# Patient Record
Sex: Male | Born: 1980 | Race: Black or African American | Hispanic: No | Marital: Single | Smoking: Never smoker
Health system: Southern US, Community
[De-identification: ages and names within clinical notes are randomized; demographics above are authoritative.]

## PROBLEM LIST (undated history)

## (undated) DIAGNOSIS — E785 Hyperlipidemia, unspecified: Secondary | ICD-10-CM

---

## 2001-02-25 ENCOUNTER — Emergency Department (HOSPITAL_COMMUNITY): Admission: EM | Admit: 2001-02-25 | Discharge: 2001-02-25 | Payer: Self-pay | Admitting: Emergency Medicine

## 2001-03-01 ENCOUNTER — Emergency Department (HOSPITAL_COMMUNITY): Admission: EM | Admit: 2001-03-01 | Discharge: 2001-03-01 | Payer: Self-pay | Admitting: Emergency Medicine

## 2001-03-06 ENCOUNTER — Emergency Department (HOSPITAL_COMMUNITY): Admission: EM | Admit: 2001-03-06 | Discharge: 2001-03-07 | Payer: Self-pay | Admitting: Emergency Medicine

## 2002-09-29 ENCOUNTER — Emergency Department (HOSPITAL_COMMUNITY): Admission: EM | Admit: 2002-09-29 | Discharge: 2002-09-29 | Payer: Self-pay | Admitting: Emergency Medicine

## 2011-12-16 ENCOUNTER — Other Ambulatory Visit: Payer: Self-pay | Admitting: *Deleted

## 2012-05-01 ENCOUNTER — Emergency Department (HOSPITAL_COMMUNITY)
Admission: EM | Admit: 2012-05-01 | Discharge: 2012-05-01 | Disposition: A | Discharge status: Home / Self Care | Payer: PRIVATE HEALTH INSURANCE | Attending: Emergency Medicine | Admitting: Emergency Medicine

## 2012-05-01 ENCOUNTER — Encounter (HOSPITAL_COMMUNITY): Payer: Self-pay

## 2012-05-01 DIAGNOSIS — Y9389 Activity, other specified: Secondary | ICD-10-CM | POA: Insufficient documentation

## 2012-05-01 DIAGNOSIS — S8990XA Unspecified injury of unspecified lower leg, initial encounter: Secondary | ICD-10-CM | POA: Insufficient documentation

## 2012-05-01 DIAGNOSIS — Y92410 Unspecified street and highway as the place of occurrence of the external cause: Secondary | ICD-10-CM | POA: Insufficient documentation

## 2012-05-01 DIAGNOSIS — S99919A Unspecified injury of unspecified ankle, initial encounter: Secondary | ICD-10-CM | POA: Insufficient documentation

## 2012-05-01 DIAGNOSIS — S79919A Unspecified injury of unspecified hip, initial encounter: Secondary | ICD-10-CM | POA: Insufficient documentation
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
## 2012-05-01 NOTE — ED Provider Notes (Signed)
Medical screening examination/treatment/procedure(s) were performed by non-physician practitioner and as supervising physician I was immediately available for consultation/collaboration.   Cameryn Schum J. Lucian Baswell, MD 05/01/12 2316 

## 2012-05-07 ENCOUNTER — Telehealth (HOSPITAL_COMMUNITY): Payer: Self-pay | Admitting: Emergency Medicine

## 2012-06-22 ENCOUNTER — Other Ambulatory Visit: Payer: Self-pay | Admitting: Family Medicine

## 2012-06-22 ENCOUNTER — Ambulatory Visit
Admission: RE | Admit: 2012-06-22 | Discharge: 2012-06-22 | Disposition: A | Discharge status: Home / Self Care | Payer: PRIVATE HEALTH INSURANCE | Source: Ambulatory Visit | Attending: Family Medicine | Admitting: Family Medicine

## 2012-06-22 DIAGNOSIS — T148XXA Other injury of unspecified body region, initial encounter: Secondary | ICD-10-CM
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
## 2014-03-31 ENCOUNTER — Emergency Department (HOSPITAL_COMMUNITY): Payer: PRIVATE HEALTH INSURANCE

## 2014-03-31 ENCOUNTER — Encounter (HOSPITAL_COMMUNITY): Payer: Self-pay | Admitting: Emergency Medicine

## 2014-03-31 DIAGNOSIS — M62838 Other muscle spasm: Secondary | ICD-10-CM | POA: Insufficient documentation

## 2014-03-31 DIAGNOSIS — Z8639 Personal history of other endocrine, nutritional and metabolic disease: Secondary | ICD-10-CM | POA: Diagnosis not present

## 2014-03-31 DIAGNOSIS — R079 Chest pain, unspecified: Secondary | ICD-10-CM | POA: Diagnosis present

## 2014-03-31 LAB — BASIC METABOLIC PANEL
Anion gap: 8 (ref 5–15)
BUN: 9 mg/dL (ref 6–23)
CALCIUM: 9.2 mg/dL (ref 8.4–10.5)
CHLORIDE: 103 meq/L (ref 96–112)
CO2: 28 mmol/L (ref 19–32)
CREATININE: 0.92 mg/dL (ref 0.50–1.35)
GFR calc non Af Amer: >90 mL/min (ref >90)
Glucose, Bld: 106 mg/dL — ABNORMAL HIGH (ref 70–99)
Potassium: 3.6 mmol/L (ref 3.5–5.1)
SODIUM: 139 mmol/L (ref 135–145)

## 2014-03-31 LAB — CBC WITH DIFFERENTIAL/PLATELET
BASOS PCT: 0 % (ref 0–1)
Basophils Absolute: 0 10*3/uL (ref 0.0–0.1)
Eosinophils Absolute: 0.2 10*3/uL (ref 0.0–0.7)
Eosinophils Relative: 2 % (ref 0–5)
HEMATOCRIT: 42 % (ref 39.0–52.0)
Hemoglobin: 13.4 g/dL (ref 13.0–17.0)
Lymphocytes Relative: 34 % (ref 12–46)
Lymphs Abs: 2.4 10*3/uL (ref 0.7–4.0)
MCH: 26.3 pg (ref 26.0–34.0)
MCHC: 31.9 g/dL (ref 30.0–36.0)
MCV: 82.4 fL (ref 78.0–100.0)
MONO ABS: 0.6 10*3/uL (ref 0.1–1.0)
Monocytes Relative: 9 % (ref 3–12)
NEUTROS PCT: 55 % (ref 43–77)
Neutro Abs: 3.9 10*3/uL (ref 1.7–7.7)
Platelets: 196 10*3/uL (ref 150–400)
RBC: 5.1 MIL/uL (ref 4.22–5.81)
RDW: 12.3 % (ref 11.5–15.5)
WBC: 7.1 10*3/uL (ref 4.0–10.5)

## 2014-03-31 LAB — BRAIN NATRIURETIC PEPTIDE: B NATRIURETIC PEPTIDE 5: 10.9 pg/mL (ref 0.0–100.0)

## 2014-03-31 LAB — I-STAT TROPONIN, ED: Troponin i, poc: 0 ng/mL (ref 0.00–0.08)

## 2014-03-31 NOTE — ED Notes (Signed)
The patient has been having chest pain since Christmas.  He thought it would go away but it has gotten worse.  He took some advil and it did not help.  He denies any SOB, dizziness, lightheadedness, nausea, vomiting or any other symptoms.  He also denies injury.

## 2014-04-01 ENCOUNTER — Encounter (HOSPITAL_COMMUNITY): Payer: Self-pay | Admitting: Emergency Medicine

## 2014-04-01 ENCOUNTER — Emergency Department (HOSPITAL_COMMUNITY)
Admission: EM | Admit: 2014-04-01 | Discharge: 2014-04-01 | Disposition: A | Discharge status: Home / Self Care | Payer: PRIVATE HEALTH INSURANCE | Attending: Emergency Medicine | Admitting: Emergency Medicine

## 2014-04-01 DIAGNOSIS — R079 Chest pain, unspecified: Secondary | ICD-10-CM

## 2014-04-01 DIAGNOSIS — M62838 Other muscle spasm: Secondary | ICD-10-CM

## 2014-04-01 HISTORY — DX: Hyperlipidemia, unspecified: E78.5

## 2014-04-01 MED ORDER — METHOCARBAMOL 500 MG PO TABS: 500.0000 mg | ORAL_TABLET | Freq: Two times a day (BID) | ORAL | Status: DC

## 2014-04-01 MED ORDER — IBUPROFEN 800 MG PO TABS: 800.0000 mg | ORAL_TABLET | Freq: Three times a day (TID) | ORAL | Status: DC

## 2014-04-01 MED ORDER — METHOCARBAMOL 500 MG PO TABS
1000.0000 mg | ORAL_TABLET | Freq: Once | ORAL | Status: AC
  Administered 2014-04-01: 1000 mg via ORAL
  Filled 2014-04-01: qty 2

## 2014-04-01 MED ORDER — TRAMADOL HCL 50 MG PO TABS: 50.0000 mg | ORAL_TABLET | Freq: Four times a day (QID) | ORAL | Status: DC | PRN

## 2014-04-01 MED ORDER — GI COCKTAIL ~~LOC~~
30.0000 mL | Freq: Once | ORAL | Status: AC
  Administered 2014-04-01: 30 mL via ORAL
  Filled 2014-04-01: qty 30

## 2014-04-01 MED ORDER — KETOROLAC TROMETHAMINE 60 MG/2ML IM SOLN
60.0000 mg | Freq: Once | INTRAMUSCULAR | Status: AC
  Administered 2014-04-01: 60 mg via INTRAMUSCULAR
  Filled 2014-04-01: qty 2

## 2014-04-01 NOTE — ED Provider Notes (Signed)
CSN: [id number]     Arrival date & time 03/31/14  2151 History  This chart was scribe for Aleicia Kenagy Smitty CordsK Rollins Wrightson-Rasch, MD by Angelene GiovanniEmmanuella Mensah, ED Scribe. The patient was seen in room D34C/D34C and the patient's care was started at 12:51 AM.     Chief Complaint  Patient presents with  . Chest Pain    The patient has been having chest pain since Christmas.     Patient is a 33 y.o. male presenting with chest pain. The history is provided by the patient. No language interpreter was used.  Chest Pain Pain location:  Unable to specify Pain quality: sharp   Pain radiates to:  Does not radiate Pain radiates to the back: no   Pain severity:  Moderate Onset quality:  Gradual Duration:  4 days Timing:  Constant Progression:  Worsening Chronicity:  New Context: raising an arm and at rest (laying down on floor)   Relieved by:  Nothing Worsened by:  Movement Ineffective treatments: NSAIDS. Associated symptoms: no cough, no fever, no palpitations and no shortness of breath   Risk factors: no aortic disease, not obese, not pregnant, no prior DVT/PE, no smoking and no surgery    HPI Comments: Rick Garcia is a 33 y.o. male who presents to the Emergency Department complaining of a gradually worsening stabbing right shoulder pain and constant CP onset 4 days ago. He reports that he has been doing push ups but denies any strenuous exercise. He states that he cannot lay down and moving his arm makes the pain worse. He denies food making his pain worse. He reports taking Advil with no relief. He denies any recent long trips.  Past Medical History  Diagnosis Date  . Hyperlipidemia    History reviewed. No pertinent past surgical history. History reviewed. No pertinent family history. History  Substance Use Topics  . Smoking status: Never Smoker   . Smokeless tobacco: Never Used  . Alcohol Use: No    Review of Systems  Constitutional: Negative for fever.  Respiratory: Negative for cough, chest  tightness, shortness of breath and wheezing.   Cardiovascular: Positive for chest pain. Negative for palpitations and leg swelling.  Musculoskeletal: Arthralgias: right shoulder.  All other systems reviewed and are negative.     Allergies  Review of patient's allergies indicates no known allergies.  Home Medications   Prior to Admission medications   Medication Sig Start Date End Date Taking? Authorizing Provider  ibuprofen (ADVIL,MOTRIN) 200 MG tablet Take 200 mg by mouth every 6 (six) hours as needed for mild pain.   Yes Historical Provider, MD   BP 160/95 mmHg  Pulse 90  Resp 20  Ht 6' (1.829 m)  Wt 195 lb (88.451 kg)  BMI 26.44 kg/m2  SpO2 99% Physical Exam  Constitutional: He is oriented to person, place, and time. He appears well-developed and well-nourished. No distress.  HENT:  Head: Normocephalic and atraumatic.  Mouth/Throat: Oropharynx is clear and moist. No oropharyngeal exudate.  Eyes: Conjunctivae and EOM are normal. Pupils are equal, round, and reactive to light.  Neck: Normal range of motion. Neck supple. No tracheal deviation present.  Cardiovascular: Normal rate, regular rhythm and intact distal pulses.   Pulmonary/Chest: Effort normal and breath sounds normal. No respiratory distress. He has no wheezes. He has no rales. He exhibits tenderness.  Lungs are clear  No crepitance on reevaluation  Abdominal: Soft. Bowel sounds are normal. There is no tenderness. There is no rebound and no guarding.  Musculoskeletal:  Normal range of motion. He exhibits no edema or tenderness.  Negative Neer's test.  Spasm in right trapecium muscle Great reflex bicep. Tricep normal  5/5 upper   Neurological: He is alert and oriented to person, place, and time. He has normal reflexes.  Good DTR in the lower   Skin: Skin is warm and dry.  Psychiatric: He has a normal mood and affect. His behavior is normal.  Nursing note and vitals reviewed.   ED Course  Procedures (including  critical care time) DIAGNOSTIC STUDIES: Oxygen Saturation is 99% on RA, normal by my interpretation.    COORDINATION OF CARE: 12:57 AM- Pt advised of plan for treatment and pt agrees.    Labs Review Labs Reviewed  BASIC METABOLIC PANEL - Abnormal; Notable for the following:    Glucose, Bld 106 (*)    All other components within normal limits  CBC WITH DIFFERENTIAL  BRAIN NATRIURETIC PEPTIDE  I-STAT TROPOININ, ED    Imaging Review Dg Chest 2 View  03/31/2014   CLINICAL DATA:  Chest pain x3 days  EXAM: CHEST  2 VIEW  COMPARISON:  None.  FINDINGS: Lungs are clear.  No pleural effusion or pneumothorax.  The heart is normal in size.  Visualized osseous structures are within normal limits.  IMPRESSION: Normal chest radiographs.   Electronically Signed   By: Charline BillsSriyesh  Krishnan M.D.   On: 03/31/2014 23:12     EKG Interpretation   Date/Time:  Monday March 31 2014 21:56:15 EST Ventricular Rate:  88 PR Interval:  134 QRS Duration: 88 QT Interval:  356 QTC Calculation: 430 R Axis:   81 Text Interpretation:  Normal sinus rhythm Confirmed by [facility]  MD,  Sallye Lunz ([id number]) on 04/01/2014 12:41:11 AM      MDM   Final diagnoses:  Chest pain   PERC Negative well's 0, highly doubt PE.  In the setting of ongoing symptoms with negative EKG and troponin ACS is excluded.  Will treat for MSK pain.     I personally performed the services described in this documentation, which was scribed in my presence. The recorded information has been reviewed and is accurate.    Jasmine AweApril K Rushawn Capshaw-Rasch, MD 04/01/14 [phone]

## 2015-01-13 ENCOUNTER — Other Ambulatory Visit: Payer: Self-pay | Admitting: Family Medicine

## 2015-01-14 LAB — CMP12+LP+TP+TSH+6AC+CBC/D/PLT
ALBUMIN: 4.7 g/dL (ref 3.5–5.5)
ALT: 20 IU/L (ref 0–44)
AST: 20 IU/L (ref 0–40)
Albumin/Globulin Ratio: 2 (ref 1.1–2.5)
Alkaline Phosphatase: 61 IU/L (ref 39–117)
BUN/Creatinine Ratio: 11 (ref 8–19)
BUN: 11 mg/dL (ref 6–20)
Basophils Absolute: 0 10*3/uL (ref 0.0–0.2)
Basos: 1 %
Bilirubin Total: 0.4 mg/dL (ref 0.0–1.2)
CALCIUM: 9.5 mg/dL (ref 8.7–10.2)
CHOLESTEROL TOTAL: 216 mg/dL — AB (ref 100–199)
Chloride: 100 mmol/L (ref 97–108)
Chol/HDL Ratio: 3.9 ratio units (ref 0.0–5.0)
Creatinine, Ser: 1 mg/dL (ref 0.76–1.27)
EOS (ABSOLUTE): 0.2 10*3/uL (ref 0.0–0.4)
ESTIMATED CHD RISK: 0.7 times avg. (ref 0.0–1.0)
Eos: 5 %
Free Thyroxine Index: 2.4 (ref 1.2–4.9)
GFR calc Af Amer: 112 mL/min/{1.73_m2} (ref >59)
GFR calc non Af Amer: 97 mL/min/{1.73_m2} (ref >59)
GGT: 25 IU/L (ref 0–65)
Globulin, Total: 2.4 g/dL (ref 1.5–4.5)
Glucose: 85 mg/dL (ref 65–99)
HDL: 56 mg/dL (ref >39)
Hematocrit: 42.4 % (ref 37.5–51.0)
Hemoglobin: 14 g/dL (ref 12.6–17.7)
IMMATURE GRANULOCYTES: 0 %
Immature Grans (Abs): 0 10*3/uL (ref 0.0–0.1)
Iron: 90 ug/dL (ref 38–169)
LDH: 150 IU/L (ref 121–224)
LDL Calculated: 148 mg/dL — ABNORMAL HIGH (ref 0–99)
LYMPHS ABS: 1.5 10*3/uL (ref 0.7–3.1)
Lymphs: 42 %
MCH: 27.1 pg (ref 26.6–33.0)
MCHC: 33 g/dL (ref 31.5–35.7)
MCV: 82 fL (ref 79–97)
MONOS ABS: 0.3 10*3/uL (ref 0.1–0.9)
Monocytes: 7 %
NEUTROS ABS: 1.6 10*3/uL (ref 1.4–7.0)
NEUTROS PCT: 45 %
PHOSPHORUS: 3 mg/dL (ref 2.5–4.5)
POTASSIUM: 4.4 mmol/L (ref 3.5–5.2)
Platelets: 205 10*3/uL (ref 150–379)
RBC: 5.16 x10E6/uL (ref 4.14–5.80)
RDW: 12.9 % (ref 12.3–15.4)
SODIUM: 140 mmol/L (ref 134–144)
T3 Uptake Ratio: 28 % (ref 24–39)
T4, Total: 8.4 ug/dL (ref 4.5–12.0)
TSH: 1.34 u[IU]/mL (ref 0.450–4.500)
Total Protein: 7.1 g/dL (ref 6.0–8.5)
Triglycerides: 60 mg/dL (ref 0–149)
Uric Acid: 5.3 mg/dL (ref 3.7–8.6)
VLDL Cholesterol Cal: 12 mg/dL (ref 5–40)
WBC: 3.6 10*3/uL (ref 3.4–10.8)

## 2015-01-14 LAB — HGB A1C W/O EAG: Hgb A1c MFr Bld: 5.8 % — ABNORMAL HIGH (ref 4.8–5.6)

## 2015-07-01 IMAGING — DX DG CHEST 2V
2 series · 2 of 2 positions shown · non-contrast
Comparison: None.

CLINICAL DATA: Chest pain x3 days

EXAM:
CHEST  2 VIEW

[chest pa]
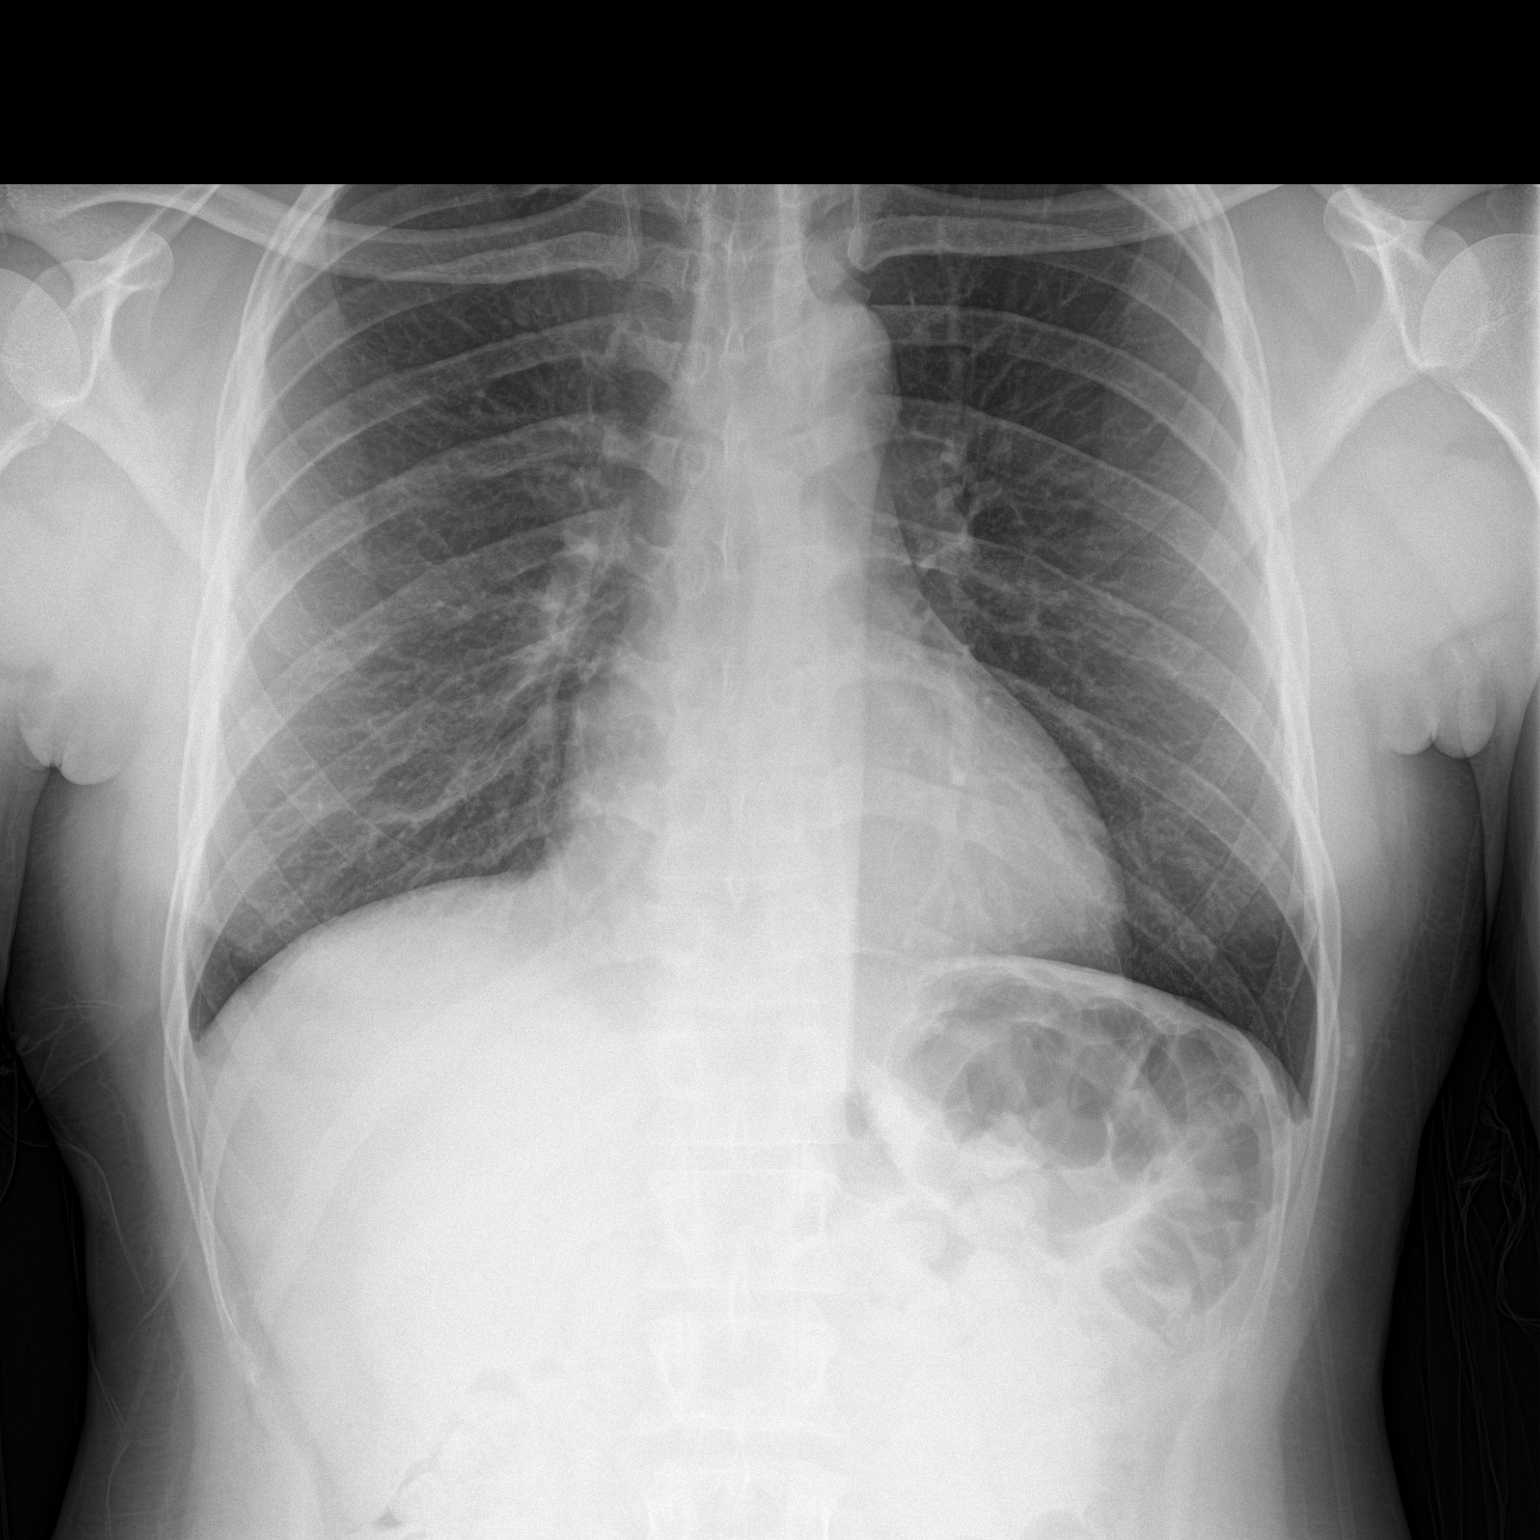

[chest lat]
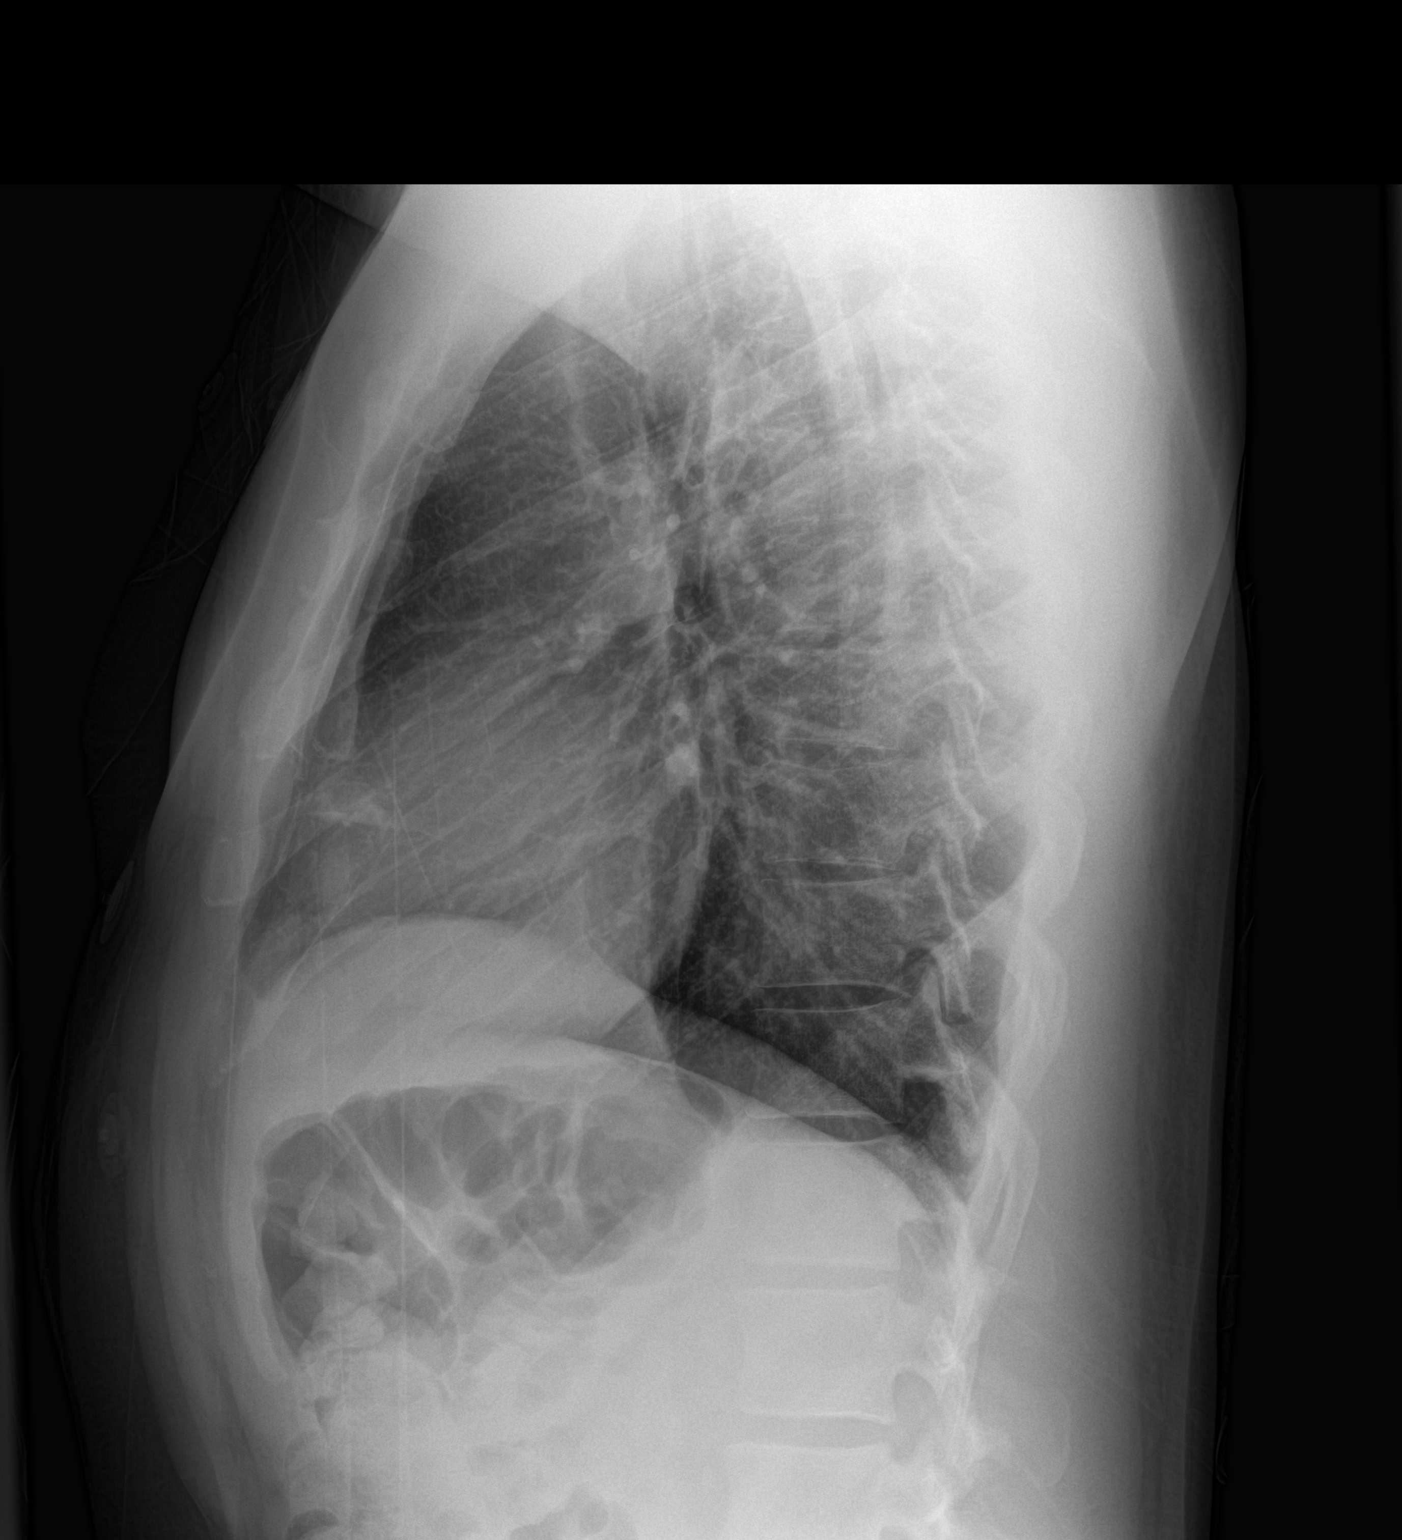

[2 of 2 positions shown; findings below may reference images not displayed]

FINDINGS: Lungs are clear.  No pleural effusion or pneumothorax.

The heart is normal in size.

Visualized osseous structures are within normal limits.
IMPRESSION: Normal chest radiographs.

## 2015-12-24 ENCOUNTER — Other Ambulatory Visit: Payer: Self-pay | Admitting: Family Medicine

## 2015-12-25 LAB — HGB A1C W/O EAG: HEMOGLOBIN A1C: 5.5 % (ref 4.8–5.6)

## 2015-12-25 LAB — VITAMIN D 25 HYDROXY (VIT D DEFICIENCY, FRACTURES): VIT D 25 HYDROXY: 18.2 ng/mL — AB (ref 30.0–100.0)

## 2015-12-28 LAB — CMP12+LP+TP+TSH+6AC+CBC/D/PLT
ALK PHOS: 61 IU/L (ref 39–117)
ALT: 25 IU/L (ref 0–44)
AST: 23 IU/L (ref 0–40)
Albumin/Globulin Ratio: 1.5 (ref 1.2–2.2)
Albumin: 4.6 g/dL (ref 3.5–5.5)
BASOS: 1 %
BUN / CREAT RATIO: 16 (ref 9–20)
BUN: 16 mg/dL (ref 6–20)
Basophils Absolute: 0 10*3/uL (ref 0.0–0.2)
Bilirubin Total: 0.4 mg/dL (ref 0.0–1.2)
CALCIUM: 9.5 mg/dL (ref 8.7–10.2)
CHLORIDE: 105 mmol/L (ref 96–106)
CREATININE: 1.03 mg/dL (ref 0.76–1.27)
Chol/HDL Ratio: 4.1 ratio units (ref 0.0–5.0)
Cholesterol, Total: 240 mg/dL — ABNORMAL HIGH (ref 100–199)
EOS (ABSOLUTE): 0.3 10*3/uL (ref 0.0–0.4)
Eos: 8 %
Estimated CHD Risk: 0.8 times avg. (ref 0.0–1.0)
Free Thyroxine Index: 2 (ref 1.2–4.9)
GFR calc Af Amer: 108 mL/min/{1.73_m2} (ref >59)
GFR, EST NON AFRICAN AMERICAN: 93 mL/min/{1.73_m2} (ref >59)
GGT: 34 IU/L (ref 0–65)
GLOBULIN, TOTAL: 3.1 g/dL (ref 1.5–4.5)
Glucose: 98 mg/dL (ref 65–99)
HDL: 59 mg/dL (ref >39)
Hematocrit: 43 % (ref 37.5–51.0)
Hemoglobin: 13.6 g/dL (ref 12.6–17.7)
Iron: 90 ug/dL (ref 38–169)
LDH: 183 IU/L (ref 121–224)
LDL CALC: 167 mg/dL — AB (ref 0–99)
LYMPHS ABS: 2.2 10*3/uL (ref 0.7–3.1)
LYMPHS: 57 %
MCH: 26.3 pg — AB (ref 26.6–33.0)
MCHC: 31.6 g/dL (ref 31.5–35.7)
MCV: 83 fL (ref 79–97)
MONOS ABS: 0.4 10*3/uL (ref 0.1–0.9)
Monocytes: 10 %
NEUTROS ABS: 0.9 10*3/uL — AB (ref 1.4–7.0)
Neutrophils: 24 %
Phosphorus: 2.4 mg/dL — ABNORMAL LOW (ref 2.5–4.5)
Platelets: 207 10*3/uL (ref 150–379)
Potassium: 4.2 mmol/L (ref 3.5–5.2)
RBC: 5.17 x10E6/uL (ref 4.14–5.80)
RDW: 13.3 % (ref 12.3–15.4)
Sodium: 143 mmol/L (ref 134–144)
T3 Uptake Ratio: 26 % (ref 24–39)
T4 TOTAL: 7.6 ug/dL (ref 4.5–12.0)
TRIGLYCERIDES: 72 mg/dL (ref 0–149)
TSH: 1.51 u[IU]/mL (ref 0.450–4.500)
Total Protein: 7.7 g/dL (ref 6.0–8.5)
Uric Acid: 5.9 mg/dL (ref 3.7–8.6)
VLDL Cholesterol Cal: 14 mg/dL (ref 5–40)
WBC: 3.9 10*3/uL (ref 3.4–10.8)

## 2015-12-28 LAB — SPECIMEN STATUS REPORT

## 2016-06-02 ENCOUNTER — Other Ambulatory Visit: Payer: Self-pay | Admitting: *Deleted

## 2016-06-02 VITALS — BP 130/82

## 2016-06-02 DIAGNOSIS — E7800 Pure hypercholesterolemia, unspecified: Secondary | ICD-10-CM

## 2016-06-02 DIAGNOSIS — E559 Vitamin D deficiency, unspecified: Secondary | ICD-10-CM

## 2016-06-02 NOTE — Progress Notes (Signed)
Labs drawn per PCP orders. 

## 2016-06-03 LAB — PLEASE NOTE

## 2016-06-03 LAB — LIPID PANEL
CHOLESTEROL TOTAL: 207 mg/dL — AB (ref 100–199)
Chol/HDL Ratio: 3.3 ratio units (ref 0.0–5.0)
HDL: 62 mg/dL (ref >39)
LDL CALC: 133 mg/dL — AB (ref 0–99)
Triglycerides: 59 mg/dL (ref 0–149)
VLDL Cholesterol Cal: 12 mg/dL (ref 5–40)

## 2016-06-03 LAB — VITAMIN D 25 HYDROXY (VIT D DEFICIENCY, FRACTURES): Vit D, 25-Hydroxy: 37.7 ng/mL (ref 30.0–100.0)

## 2016-11-24 ENCOUNTER — Ambulatory Visit: Payer: Self-pay | Admitting: *Deleted

## 2016-11-24 VITALS — BP 124/90 | Ht 73.0 in | Wt 205.0 lb

## 2016-11-24 DIAGNOSIS — Z0000 Encounter for general adult medical examination without abnormal findings: Secondary | ICD-10-CM

## 2016-11-24 NOTE — Progress Notes (Signed)
[employer] insurance premium discount evaluation: Labs Drawn. Replacements ROI form signed. Tobacco Free Attestation form signed.  Forms placed in paper chart.  Plan to recheck BP at result review appt.  Okay to route results to pcp per pt.

## 2016-11-25 LAB — CMP12+LP+TP+TSH+6AC+CBC/D/PLT
ALK PHOS: 54 IU/L (ref 39–117)
ALT: 35 IU/L (ref 0–44)
AST: 25 IU/L (ref 0–40)
Albumin/Globulin Ratio: 1.8 (ref 1.2–2.2)
Albumin: 4.8 g/dL (ref 3.5–5.5)
BASOS ABS: 0 10*3/uL (ref 0.0–0.2)
BUN/Creatinine Ratio: 14 (ref 9–20)
BUN: 15 mg/dL (ref 6–20)
Basos: 1 %
Bilirubin Total: 0.5 mg/dL (ref 0.0–1.2)
CHLORIDE: 103 mmol/L (ref 96–106)
Calcium: 9.5 mg/dL (ref 8.7–10.2)
Chol/HDL Ratio: 3.6 ratio (ref 0.0–5.0)
Cholesterol, Total: 208 mg/dL — ABNORMAL HIGH (ref 100–199)
Creatinine, Ser: 1.05 mg/dL (ref 0.76–1.27)
EOS (ABSOLUTE): 0.1 10*3/uL (ref 0.0–0.4)
ESTIMATED CHD RISK: 0.6 times avg. (ref 0.0–1.0)
Eos: 3 %
FREE THYROXINE INDEX: 3 (ref 1.2–4.9)
GFR calc Af Amer: 104 mL/min/{1.73_m2} (ref >59)
GFR calc non Af Amer: 90 mL/min/{1.73_m2} (ref >59)
GGT: 32 IU/L (ref 0–65)
Globulin, Total: 2.7 g/dL (ref 1.5–4.5)
Glucose: 85 mg/dL (ref 65–99)
HDL: 57 mg/dL (ref >39)
HEMATOCRIT: 43.4 % (ref 37.5–51.0)
HEMOGLOBIN: 14.1 g/dL (ref 13.0–17.7)
IMMATURE GRANULOCYTES: 0 %
Immature Grans (Abs): 0 10*3/uL (ref 0.0–0.1)
Iron: 108 ug/dL (ref 38–169)
LDH: 178 IU/L (ref 121–224)
LDL CALC: 138 mg/dL — AB (ref 0–99)
Lymphocytes Absolute: 1.7 10*3/uL (ref 0.7–3.1)
Lymphs: 42 %
MCH: 27 pg (ref 26.6–33.0)
MCHC: 32.5 g/dL (ref 31.5–35.7)
MCV: 83 fL (ref 79–97)
Monocytes Absolute: 0.5 10*3/uL (ref 0.1–0.9)
Monocytes: 12 %
NEUTROS PCT: 42 %
Neutrophils Absolute: 1.6 10*3/uL (ref 1.4–7.0)
PLATELETS: 173 10*3/uL (ref 150–379)
Phosphorus: 2.4 mg/dL — ABNORMAL LOW (ref 2.5–4.5)
Potassium: 4.3 mmol/L (ref 3.5–5.2)
RBC: 5.22 x10E6/uL (ref 4.14–5.80)
RDW: 13 % (ref 12.3–15.4)
Sodium: 140 mmol/L (ref 134–144)
T3 Uptake Ratio: 34 % (ref 24–39)
T4, Total: 8.8 ug/dL (ref 4.5–12.0)
TSH: 1.82 u[IU]/mL (ref 0.450–4.500)
Total Protein: 7.5 g/dL (ref 6.0–8.5)
Triglycerides: 64 mg/dL (ref 0–149)
URIC ACID: 5.6 mg/dL (ref 3.7–8.6)
VLDL Cholesterol Cal: 13 mg/dL (ref 5–40)
WBC: 4 10*3/uL (ref 3.4–10.8)

## 2016-11-25 LAB — HGB A1C W/O EAG: Hgb A1c MFr Bld: 5.7 % — ABNORMAL HIGH (ref 4.8–5.6)

## 2016-11-25 LAB — VITAMIN D 25 HYDROXY (VIT D DEFICIENCY, FRACTURES): Vit D, 25-Hydroxy: 32.4 ng/mL (ref 30.0–100.0)

## 2016-11-25 NOTE — Progress Notes (Signed)
Results reviewed with pt. Total chol and LDL remain elevated, similar to previous. Phosphorus slightly low, same as previous. A1c now prediabetic. Diet and exercise recommendations discussed for A1c and lipids. Encouraged to discuss adding fish oil supplement with pcp for improving lipids as he feels he has good diet that avoids most high chol foods and already exercises, but lipids remain elevated. Also informed him he could increase Vit D supplement to 2000-units daily since level is low normal. Copt provided to pt. Results routed to pcp per pt request. No further questions/concerns.

## 2018-05-14 ENCOUNTER — Ambulatory Visit: Payer: Self-pay | Admitting: *Deleted

## 2018-05-14 VITALS — BP 133/81 | HR 73 | Ht 73.0 in | Wt 212.0 lb

## 2018-05-14 DIAGNOSIS — Z0000 Encounter for general adult medical examination without abnormal findings: Secondary | ICD-10-CM

## 2018-05-14 DIAGNOSIS — R7989 Other specified abnormal findings of blood chemistry: Secondary | ICD-10-CM

## 2018-05-14 NOTE — Progress Notes (Signed)
[employer] insurance premium discount evaluation: Labs Drawn. Replacements ROI form signed. Tobacco Free Attestation form signed.  Forms placed in paper chart. Okay to route results to pcp per pt. 

## 2018-05-15 LAB — CMP12+LP+TP+TSH+6AC+PSA+CBC…
ALT: 28 IU/L (ref 0–44)
AST: 26 IU/L (ref 0–40)
Albumin/Globulin Ratio: 1.8 (ref 1.2–2.2)
Albumin: 4.7 g/dL (ref 4.0–5.0)
Alkaline Phosphatase: 72 IU/L (ref 39–117)
BUN / CREAT RATIO: 11 (ref 9–20)
BUN: 12 mg/dL (ref 6–20)
Basophils Absolute: 0 10*3/uL (ref 0.0–0.2)
Basos: 1 %
Bilirubin Total: 0.3 mg/dL (ref 0.0–1.2)
CALCIUM: 9.3 mg/dL (ref 8.7–10.2)
CHOL/HDL RATIO: 4.4 ratio (ref 0.0–5.0)
CREATININE: 1.1 mg/dL (ref 0.76–1.27)
Chloride: 101 mmol/L (ref 96–106)
Cholesterol, Total: 235 mg/dL — ABNORMAL HIGH (ref 100–199)
EOS (ABSOLUTE): 0.2 10*3/uL (ref 0.0–0.4)
ESTIMATED CHD RISK: 0.8 times avg. (ref 0.0–1.0)
Eos: 4 %
Free Thyroxine Index: 1.7 (ref 1.2–4.9)
GFR calc Af Amer: 98 mL/min/{1.73_m2} (ref >59)
GFR calc non Af Amer: 85 mL/min/{1.73_m2} (ref >59)
GGT: 34 IU/L (ref 0–65)
Globulin, Total: 2.6 g/dL (ref 1.5–4.5)
Glucose: 86 mg/dL (ref 65–99)
HDL: 54 mg/dL (ref >39)
Hematocrit: 42.8 % (ref 37.5–51.0)
Hemoglobin: 13.8 g/dL (ref 13.0–17.7)
Immature Grans (Abs): 0 10*3/uL (ref 0.0–0.1)
Immature Granulocytes: 0 %
Iron: 92 ug/dL (ref 38–169)
LDH: 184 IU/L (ref 121–224)
LDL Calculated: 165 mg/dL — ABNORMAL HIGH (ref 0–99)
Lymphocytes Absolute: 1.4 10*3/uL (ref 0.7–3.1)
Lymphs: 39 %
MCH: 26.8 pg (ref 26.6–33.0)
MCHC: 32.2 g/dL (ref 31.5–35.7)
MCV: 83 fL (ref 79–97)
Monocytes Absolute: 0.3 10*3/uL (ref 0.1–0.9)
Monocytes: 9 %
Neutrophils Absolute: 1.7 10*3/uL (ref 1.4–7.0)
Neutrophils: 47 %
PHOSPHORUS: 2.7 mg/dL — AB (ref 2.8–4.1)
POTASSIUM: 4 mmol/L (ref 3.5–5.2)
Platelets: 190 10*3/uL (ref 150–450)
Prostate Specific Ag, Serum: 0.5 ng/mL (ref 0.0–4.0)
RBC: 5.15 x10E6/uL (ref 4.14–5.80)
RDW: 12.7 % (ref 11.6–15.4)
Sodium: 140 mmol/L (ref 134–144)
T3 Uptake Ratio: 25 % (ref 24–39)
T4 TOTAL: 6.8 ug/dL (ref 4.5–12.0)
TSH: 1.45 u[IU]/mL (ref 0.450–4.500)
Total Protein: 7.3 g/dL (ref 6.0–8.5)
Triglycerides: 82 mg/dL (ref 0–149)
Uric Acid: 5.2 mg/dL (ref 3.7–8.6)
VLDL Cholesterol Cal: 16 mg/dL (ref 5–40)
WBC: 3.7 10*3/uL (ref 3.4–10.8)

## 2018-05-15 LAB — HGB A1C W/O EAG: HEMOGLOBIN A1C: 5.8 % — AB (ref 4.8–5.6)

## 2018-05-15 LAB — VITAMIN D 25 HYDROXY (VIT D DEFICIENCY, FRACTURES): Vit D, 25-Hydroxy: 19.8 ng/mL — ABNORMAL LOW (ref 30.0–100.0)

## 2018-05-22 NOTE — Progress Notes (Signed)
noted 

## 2019-06-13 ENCOUNTER — Ambulatory Visit: Payer: PRIVATE HEALTH INSURANCE | Attending: Internal Medicine

## 2020-03-04 DIAGNOSIS — Z03818 Encounter for observation for suspected exposure to other biological agents ruled out: Secondary | ICD-10-CM | POA: Diagnosis not present

## 2020-03-04 DIAGNOSIS — Z20822 Contact with and (suspected) exposure to covid-19: Secondary | ICD-10-CM | POA: Diagnosis not present

## 2020-03-05 DIAGNOSIS — Z20822 Contact with and (suspected) exposure to covid-19: Secondary | ICD-10-CM | POA: Diagnosis not present

## 2020-05-19 DIAGNOSIS — Z23 Encounter for immunization: Secondary | ICD-10-CM | POA: Diagnosis not present

## 2020-05-19 DIAGNOSIS — E7800 Pure hypercholesterolemia, unspecified: Secondary | ICD-10-CM | POA: Diagnosis not present

## 2020-05-19 DIAGNOSIS — Z0000 Encounter for general adult medical examination without abnormal findings: Secondary | ICD-10-CM | POA: Diagnosis not present

## 2020-05-19 DIAGNOSIS — R6882 Decreased libido: Secondary | ICD-10-CM | POA: Diagnosis not present

## 2020-06-15 DIAGNOSIS — H40013 Open angle with borderline findings, low risk, bilateral: Secondary | ICD-10-CM | POA: Diagnosis not present

## 2020-06-22 DIAGNOSIS — H18622 Keratoconus, unstable, left eye: Secondary | ICD-10-CM | POA: Diagnosis not present

## 2020-09-22 DIAGNOSIS — H18621 Keratoconus, unstable, right eye: Secondary | ICD-10-CM | POA: Diagnosis not present

## 2020-09-25 DIAGNOSIS — H18621 Keratoconus, unstable, right eye: Secondary | ICD-10-CM | POA: Diagnosis not present

## 2020-10-02 DIAGNOSIS — H18621 Keratoconus, unstable, right eye: Secondary | ICD-10-CM | POA: Diagnosis not present

## 2020-11-19 DIAGNOSIS — E7800 Pure hypercholesterolemia, unspecified: Secondary | ICD-10-CM | POA: Diagnosis not present

## 2021-01-15 DIAGNOSIS — Y33XXXA Other specified events, undetermined intent, initial encounter: Secondary | ICD-10-CM | POA: Diagnosis not present

## 2021-01-15 DIAGNOSIS — X58XXXA Exposure to other specified factors, initial encounter: Secondary | ICD-10-CM | POA: Diagnosis not present

## 2021-01-15 DIAGNOSIS — T751XXA Unspecified effects of drowning and nonfatal submersion, initial encounter: Secondary | ICD-10-CM | POA: Diagnosis not present

## 2021-01-15 DIAGNOSIS — R000 Tachycardia, unspecified: Secondary | ICD-10-CM | POA: Diagnosis not present

## 2021-01-15 DIAGNOSIS — I517 Cardiomegaly: Secondary | ICD-10-CM | POA: Diagnosis not present

## 2021-01-15 DIAGNOSIS — S299XXA Unspecified injury of thorax, initial encounter: Secondary | ICD-10-CM | POA: Diagnosis not present

## 2021-01-15 DIAGNOSIS — R140 Abdominal distension (gaseous): Secondary | ICD-10-CM | POA: Diagnosis not present

## 2021-01-15 DIAGNOSIS — R109 Unspecified abdominal pain: Secondary | ICD-10-CM | POA: Diagnosis not present

## 2021-01-15 DIAGNOSIS — R1084 Generalized abdominal pain: Secondary | ICD-10-CM | POA: Diagnosis not present

## 2021-01-15 DIAGNOSIS — T1490XA Injury, unspecified, initial encounter: Secondary | ICD-10-CM | POA: Diagnosis not present

## 2021-01-15 DIAGNOSIS — J688 Other respiratory conditions due to chemicals, gases, fumes and vapors: Secondary | ICD-10-CM | POA: Diagnosis not present

## 2021-01-15 DIAGNOSIS — R52 Pain, unspecified: Secondary | ICD-10-CM | POA: Diagnosis not present

## 2021-01-15 DIAGNOSIS — R9431 Abnormal electrocardiogram [ECG] [EKG]: Secondary | ICD-10-CM | POA: Diagnosis not present

## 2022-09-18 NOTE — Progress Notes (Signed)
noted 

## 2022-11-24 ENCOUNTER — Other Ambulatory Visit: Payer: Self-pay

## 2022-11-24 ENCOUNTER — Emergency Department (HOSPITAL_COMMUNITY): Payer: Managed Care, Other (non HMO)

## 2022-11-24 ENCOUNTER — Emergency Department (HOSPITAL_COMMUNITY)
Admission: EM | Admit: 2022-11-24 | Discharge: 2022-11-24 | Disposition: A | Discharge status: Home / Self Care | Payer: Managed Care, Other (non HMO) | Attending: Student | Admitting: Student

## 2022-11-24 DIAGNOSIS — R079 Chest pain, unspecified: Secondary | ICD-10-CM | POA: Insufficient documentation

## 2022-11-24 LAB — CBC
HCT: 42 % (ref 39.0–52.0)
Hemoglobin: 13.4 g/dL (ref 13.0–17.0)
MCH: 27.1 pg (ref 26.0–34.0)
MCHC: 31.9 g/dL (ref 30.0–36.0)
MCV: 85 fL (ref 80.0–100.0)
Platelets: 166 10*3/uL (ref 150–400)
RBC: 4.94 MIL/uL (ref 4.22–5.81)
RDW: 12.4 % (ref 11.5–15.5)
WBC: 4.1 10*3/uL (ref 4.0–10.5)
nRBC: 0 % (ref 0.0–0.2)

## 2022-11-24 LAB — BASIC METABOLIC PANEL
Anion gap: 9 (ref 5–15)
BUN: 10 mg/dL (ref 6–20)
CO2: 22 mmol/L (ref 22–32)
Calcium: 8.7 mg/dL — ABNORMAL LOW (ref 8.9–10.3)
Chloride: 104 mmol/L (ref 98–111)
Creatinine, Ser: 1.11 mg/dL (ref 0.61–1.24)
GFR, Estimated: >60 mL/min (ref >60)
Glucose, Bld: 98 mg/dL (ref 70–99)
Potassium: 3.9 mmol/L (ref 3.5–5.1)
Sodium: 135 mmol/L (ref 135–145)

## 2022-11-24 LAB — TROPONIN I (HIGH SENSITIVITY)
Troponin I (High Sensitivity): 3 ng/L (ref <18)
Troponin I (High Sensitivity): 4 ng/L (ref <18)

## 2022-11-24 MED ORDER — ASPIRIN 325 MG PO TBEC
325.0000 mg | DELAYED_RELEASE_TABLET | Freq: Once | ORAL | Status: AC
  Administered 2022-11-24: 325 mg via ORAL
  Filled 2022-11-24: qty 1

## 2022-11-24 NOTE — ED Triage Notes (Signed)
Patient woke up this morning with intermitting left upper chest pain , denies emesis /no SOB .

## 2022-11-24 NOTE — ED Provider Notes (Signed)
[place] EMERGENCY DEPARTMENT AT [facility] Provider Note   CSN: [id number] Arrival date & time: 11/24/22  4098     History  Chief Complaint  Patient presents with   Chest Pain    Rick Garcia is a 42 y.o. male with no PMH who presents to ED c/o L sided chest pain that woke him from his sleep around 3am this morning. No associated dyspnea, cough, congestion, fever, lightheadedness or dizziness, neck pain, syncope, or radiating chest pain. This happened once before but was years ago. No injury to the chest. Not worse with breathing, movement, or exertion. No personal or family history of CAD. No long distance travel, hormone use, history of DVT/PE, recent surgery, or leg pain/swelling. Rates pain a 3/10. No medication taken before arrival.     Home Medications No daily medications  Allergies    Patient has no known allergies.    Review of Systems   Review of Systems  All other systems reviewed and are negative.   Physical Exam Updated Vital Signs BP 131/79   Pulse 63   Temp 98.3 F (36.8 C) (Oral)   Resp (!) 22   SpO2 100%  Physical Exam Vitals and nursing note reviewed.  Constitutional:      General: He is not in acute distress.    Appearance: Normal appearance. He is not ill-appearing or toxic-appearing.  HENT:     Head: Normocephalic and atraumatic.     Mouth/Throat:     Mouth: Mucous membranes are moist.  Eyes:     Extraocular Movements: Extraocular movements intact.     Conjunctiva/sclera: Conjunctivae normal.     Pupils: Pupils are equal, round, and reactive to light.  Neck:     Vascular: No JVD.  Cardiovascular:     Rate and Rhythm: Normal rate and regular rhythm.     Heart sounds: No murmur heard.    No S3 or S4 sounds.  Pulmonary:     Effort: Pulmonary effort is normal. No tachypnea, accessory muscle usage or respiratory distress.     Breath sounds: Normal breath sounds. No stridor. No decreased breath sounds, wheezing, rhonchi or  rales.  Chest:     Chest wall: No mass, deformity, tenderness, crepitus or edema.  Abdominal:     General: Abdomen is flat.     Palpations: Abdomen is soft. There is no pulsatile mass.     Tenderness: There is no abdominal tenderness. There is no guarding or rebound.  Musculoskeletal:        General: Normal range of motion.     Cervical back: Neck supple.     Right lower leg: No tenderness. No edema.     Left lower leg: No tenderness. No edema.  Skin:    General: Skin is warm and dry.     Capillary Refill: Capillary refill takes less than 2 seconds.  Neurological:     General: No focal deficit present.     Mental Status: He is alert and oriented to person, place, and time. Mental status is at baseline.  Psychiatric:        Mood and Affect: Mood normal.        Behavior: Behavior normal.     ED Results / Procedures / Treatments   Labs (all labs ordered are listed, but only abnormal results are displayed) Labs Reviewed  BASIC METABOLIC PANEL - Abnormal; Notable for the following components:      Result Value   Calcium 8.7 (*)  All other components within normal limits  CBC  TROPONIN I (HIGH SENSITIVITY)    EKG None  Radiology DG Chest 2 View  Result Date: 11/24/2022 CLINICAL DATA:  42 year old male with history of chest pain. EXAM: CHEST - 2 VIEW COMPARISON:  Chest x-ray 03/31/2014. FINDINGS: Lung volumes are normal. No consolidative airspace disease. No pleural effusions. No pneumothorax. No pulmonary nodule or mass noted. Pulmonary vasculature and the cardiomediastinal silhouette are within normal limits. IMPRESSION: No radiographic evidence of acute cardiopulmonary disease. Electronically Signed   By: Trudie Reed M.D.   On: 11/24/2022 05:35    Procedures Procedures    Medications Ordered in ED Medications  aspirin EC tablet 325 mg (325 mg Oral Given 11/24/22 0526)    ED Course/ Medical Decision Making/ A&P Clinical Course as of 11/24/22 0708  Thu Nov 24, 2022  0605 Updated on findings at this time.  Chest pain is improving.  Declines pain medication.  Will repeat troponin, anticipate discharge home. [MG]  0702 Mild atypical CP overnight. Pending trop2 [WL]    Clinical Course User Index [MG] Tonette Lederer, PA-C [WL] Dyanne Iha, MD                                 Medical Decision Making Amount and/or Complexity of Data Reviewed Labs: ordered. Decision-making details documented in ED Course. Radiology: ordered. Decision-making details documented in ED Course. ECG/medicine tests: ordered. Decision-making details documented in ED Course.  Risk OTC drugs.   Medical Decision Making:   Rick Garcia is a 42 y.o. male who presented to the ED today with chest pain detailed above.     Complete initial physical exam performed, notably the patient  was in NAD. RRR, LCTA, abdomen soft and nontender. No pulsatile mass. No LE edema, no calf tenderness.    Reviewed and confirmed nursing documentation for past medical history, family history, social history.    Initial Assessment:   With the patient's presentation of chest pain, the emergent differential diagnosis of chest pain includes: Acute coronary syndrome, pericarditis, aortic dissection, pulmonary embolism, tension pneumothorax, and esophageal rupture. Other urgent/non-acute considerations include, but are not limited to: chronic angina, aortic stenosis, cardiomyopathy, myocarditis, mitral valve prolapse, pulmonary hypertension, hypertrophic obstructive cardiomyopathy (HOCM), aortic insufficiency, right ventricular hypertrophy, pneumonia, pleuritis, bronchitis, pneumothorax, tumor, gastroesophageal reflux disease (GERD), esophageal spasm, Mallory-Weiss syndrome, peptic ulcer disease, biliary disease, pancreatitis, functional gastrointestinal pain, cervical or thoracic disk disease or arthritis, shoulder arthritis, costochondritis, subacromial bursitis, anxiety or panic attack, herpes  zoster, breast disorders, chest wall tumors, thoracic outlet syndrome, mediastinitis.    Initial Plan:  Screening labs including CBC and Metabolic panel to evaluate for infectious or metabolic etiology of disease.  CXR to evaluate for structural/infectious intrathoracic pathology.  EKG and troponin to evaluate for cardiac pathology Objective evaluation as reviewed   Initial Study Results:   Laboratory  All laboratory results reviewed without evidence of clinically relevant pathology.    EKG EKG was reviewed independently. ST segments without concerns for elevations.   EKG: normal EKG, normal sinus rhythm.   Radiology:  All images reviewed independently. Agree with radiology report at this time.   DG Chest 2 View  Result Date: 11/24/2022 CLINICAL DATA:  42 year old male with history of chest pain. EXAM: CHEST - 2 VIEW COMPARISON:  Chest x-ray 03/31/2014. FINDINGS: Lung volumes are normal. No consolidative airspace disease. No pleural effusions. No pneumothorax. No pulmonary nodule or  mass noted. Pulmonary vasculature and the cardiomediastinal silhouette are within normal limits. IMPRESSION: No radiographic evidence of acute cardiopulmonary disease. Electronically Signed   By: Trudie Reed M.D.   On: 11/24/2022 05:35      Final Assessment and Plan:   42 year old male presenting to the ED with left-sided chest pain.  EKG without acute ST-T changes or arrhythmia. No reproducible tenderness. No recent infectious symptoms. Pt well appearing, LCTA, no respiratory distress, afebrile and maintaining oxygen saturation on room air.  Chest x-ray negative.  PERC negative. No LE edema, does not appear fluid overloaded. Low suspicion for ACS.  No personal or family history of CAD.  No significant risk factors.  Chest pain workup initiated. Given full dose ASA.  First troponin normal.  Unremarkable CBC, metabolic panel.  Given timeline of symptoms, will repeat troponin.  Again low suspicion for ACS and  suspect that this will be normal. Patient care signed out to Dr. Mcneil Sober pending repeat troponin and disposition.    Clinical Impression:  1. Chest pain, unspecified type      Data Unavailable           Final Clinical Impression(s) / ED Diagnoses Final diagnoses:  Chest pain, unspecified type    Rx / DC Orders ED Discharge Orders     None         Richardson Dopp 11/24/22 0709    Glendora Score, MD 11/24/22 1909

## 2022-11-24 NOTE — Discharge Instructions (Signed)
You were seen today for chest pain. While you were here we monitored your vitals, performed a physical exam, and checked an EKG, blood work, and a chest x-ray. These were all reassuring and there is no indication for any further testing or intervention in the emergency department at this time.   Things to do:  - Follow up with your primary care provider within the next 1-2 weeks -Take ibuprofen and Tylenol in alternation as needed for pain control  Return to the emergency department if you have any new or worsening symptoms including worsening chest pain, shortness of breath, palpitations, or if you have any other concerns.

## 2022-11-24 NOTE — ED Provider Notes (Signed)
  Physical Exam  BP (!) 124/91   Pulse 62   Temp 98.3 F (36.8 C) (Oral)   Resp 13   Ht 6\' 1"  (1.854 m)   Wt 96.2 kg   SpO2 100%   BMI 27.98 kg/m   Physical Exam Vitals and nursing note reviewed.  Constitutional:      General: He is not in acute distress.    Appearance: He is well-developed.  HENT:     Head: Normocephalic and atraumatic.  Eyes:     Conjunctiva/sclera: Conjunctivae normal.  Cardiovascular:     Rate and Rhythm: Normal rate and regular rhythm.  Pulmonary:     Effort: Pulmonary effort is normal. No respiratory distress.  Abdominal:     Palpations: Abdomen is soft.  Musculoskeletal:        General: No swelling.     Cervical back: Neck supple.  Neurological:     Mental Status: He is alert.  Psychiatric:        Mood and Affect: Mood normal.     Procedures  Procedures  ED Course / MDM   Clinical Course as of 11/24/22 0846  Thu Nov 24, 2022  0605 Updated on findings at this time.  Chest pain is improving.  Declines pain medication.  Will repeat troponin, anticipate discharge home. [MG]  0702 Mild atypical CP overnight. Pending trop2 [WL]  0831 Second troponin resulted within normal limits. Pain remains well controlled. Will plan for discharge with strict return precautions and OP follow up [WL]    Clinical Course User Index [MG] Tonette Lederer, PA-C [WL] Dyanne Iha, MD   Medical Decision Making Amount and/or Complexity of Data Reviewed Labs: ordered. Radiology: ordered.  Risk OTC drugs.         Dyanne Iha, MD 11/24/22 4403    Derwood Kaplan, MD 11/24/22 1124
